# Patient Record
Sex: Female | Born: 1975 | Race: White | Hispanic: No | Smoking: Current every day smoker
Health system: Southern US, Community
[De-identification: ages and names within clinical notes are randomized; demographics above are authoritative.]

## PROBLEM LIST (undated history)

## (undated) DIAGNOSIS — R87619 Unspecified abnormal cytological findings in specimens from cervix uteri: Secondary | ICD-10-CM

## (undated) DIAGNOSIS — O24419 Gestational diabetes mellitus in pregnancy, unspecified control: Secondary | ICD-10-CM

## (undated) DIAGNOSIS — N946 Dysmenorrhea, unspecified: Secondary | ICD-10-CM

## (undated) DIAGNOSIS — Z8742 Personal history of other diseases of the female genital tract: Secondary | ICD-10-CM

## (undated) HISTORY — DX: Unspecified abnormal cytological findings in specimens from cervix uteri: R87.619

## (undated) HISTORY — DX: Dysmenorrhea, unspecified: N94.6

## (undated) HISTORY — PX: TONSILLECTOMY: SUR1361

## (undated) HISTORY — DX: Gestational diabetes mellitus in pregnancy, unspecified control: O24.419

---
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
## 2000-03-31 DIAGNOSIS — Z8742 Personal history of other diseases of the female genital tract: Secondary | ICD-10-CM

## 2000-03-31 HISTORY — DX: Personal history of other diseases of the female genital tract: Z87.42

## 2000-03-31 HISTORY — PX: PELVIC LAPAROSCOPY: SHX162

## 2004-08-01 ENCOUNTER — Ambulatory Visit (HOSPITAL_COMMUNITY): Admission: RE | Admit: 2004-08-01 | Discharge: 2004-08-01 | Payer: Self-pay | Admitting: Obstetrics and Gynecology

## 2004-08-16 ENCOUNTER — Ambulatory Visit (HOSPITAL_COMMUNITY): Admission: RE | Admit: 2004-08-16 | Discharge: 2004-08-16 | Payer: Self-pay | Admitting: Obstetrics and Gynecology

## 2005-10-05 IMAGING — US US OB FOLLOW-UP
1 series · 18 of 28 positions shown · non-contrast
Comparison: none

CLINICAL DATA: 29-year-old.  G2 P1 with LMP of 01/27/04.  Scan for growth.

[Series 1: us ob re-eval · 18 of 44 slices shown]
[im 1/44]
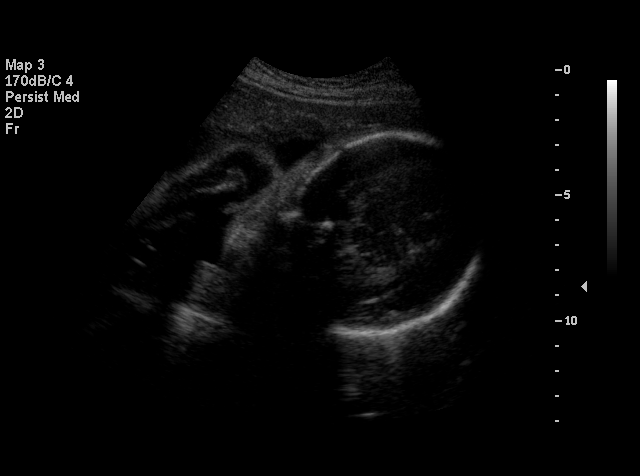
[im 4/44]
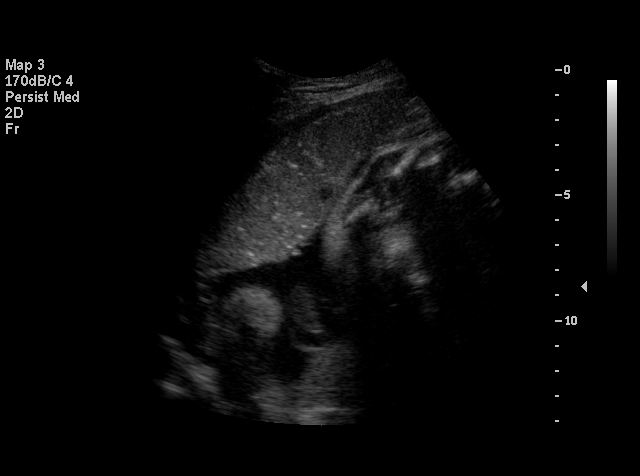
[im 5/44]
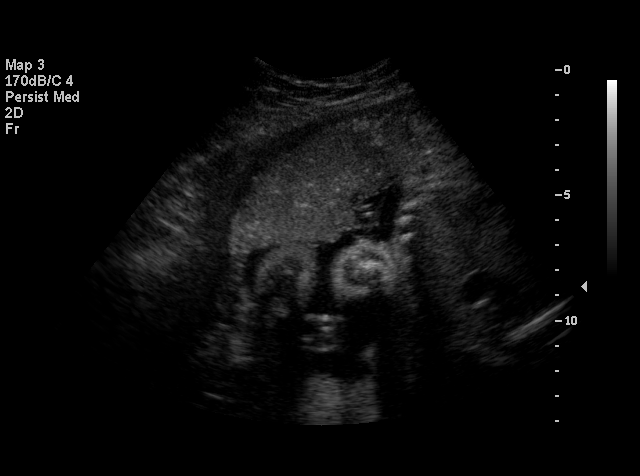
[im 8/44]
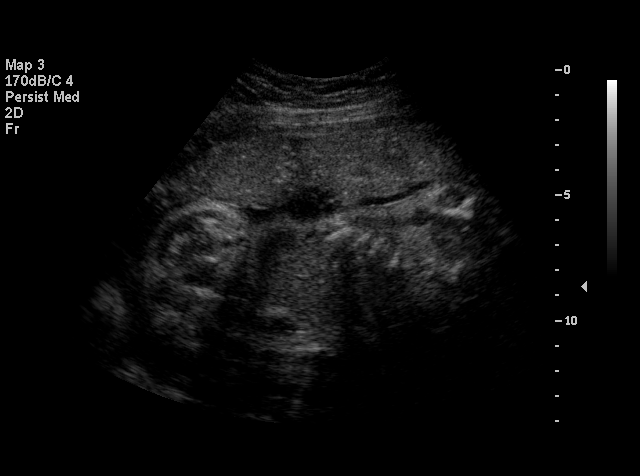
[im 12/44]
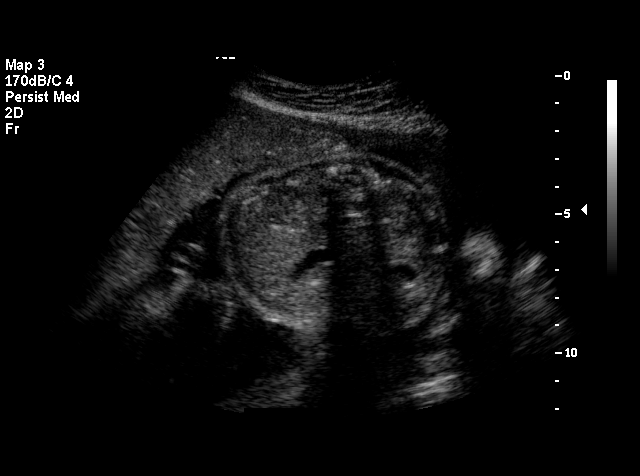
[im 13/44]
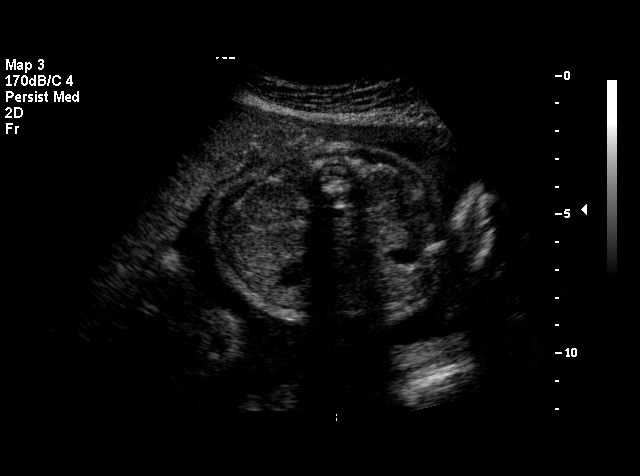
[im 16/44]
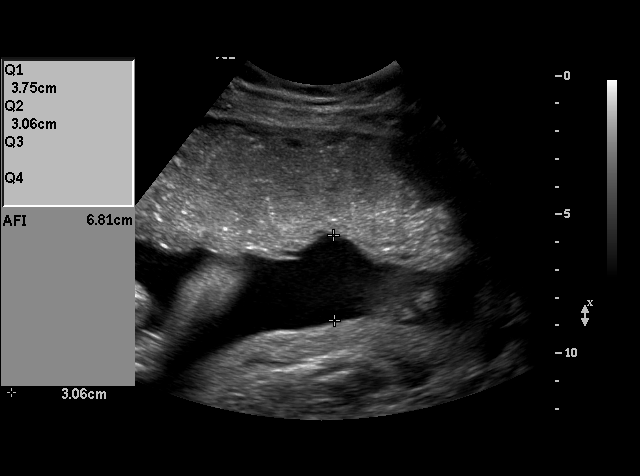
[im 18/44]
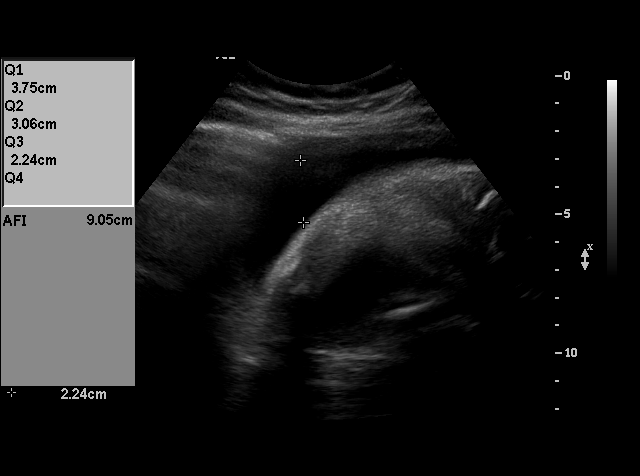
[im 21/44]
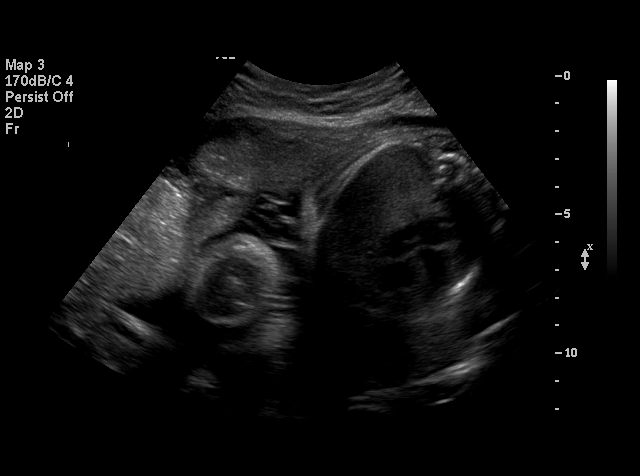
[im 23/44]
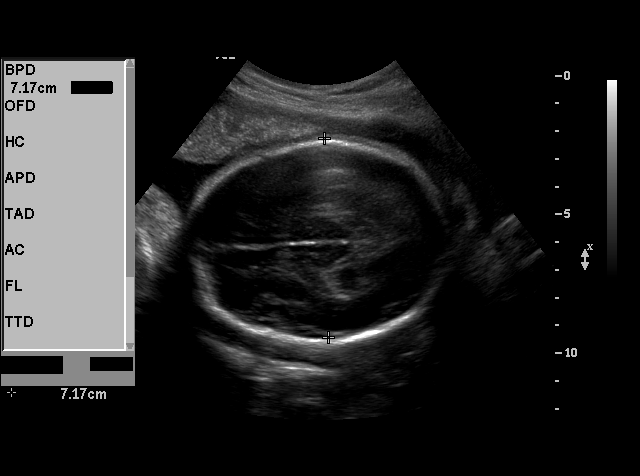
[im 26/44]
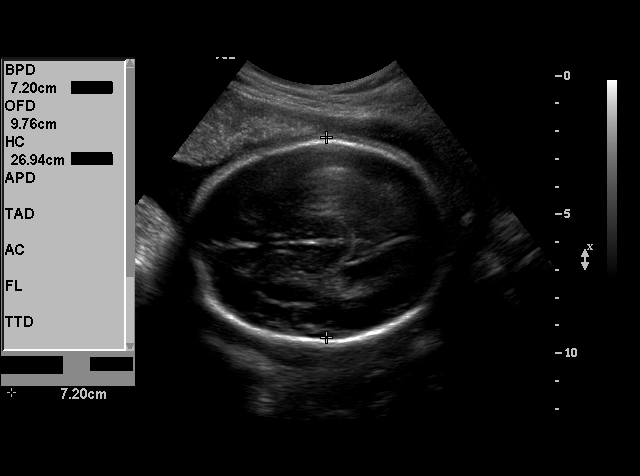
[im 28/44]
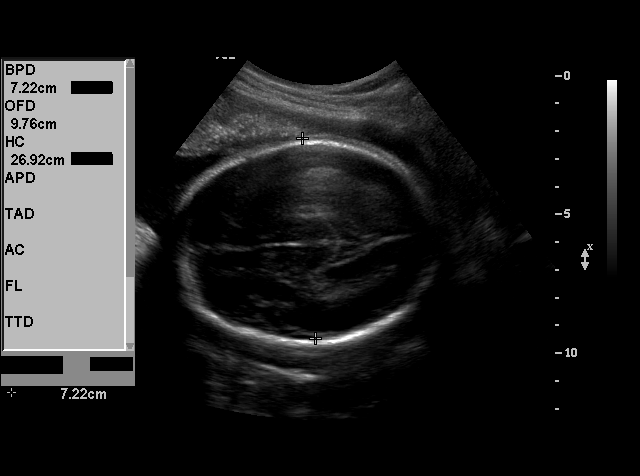
[im 31/44]
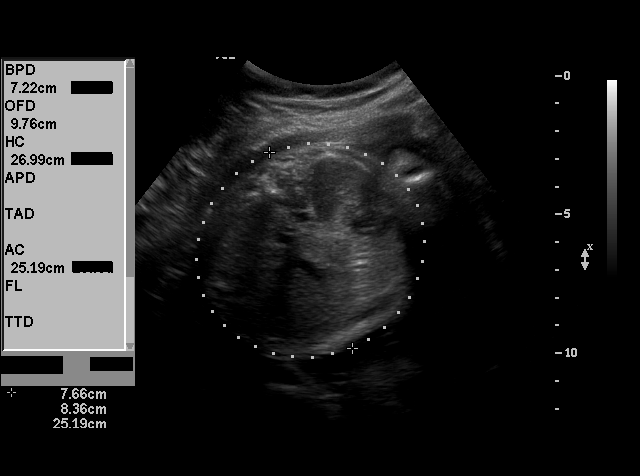
[im 34/44]
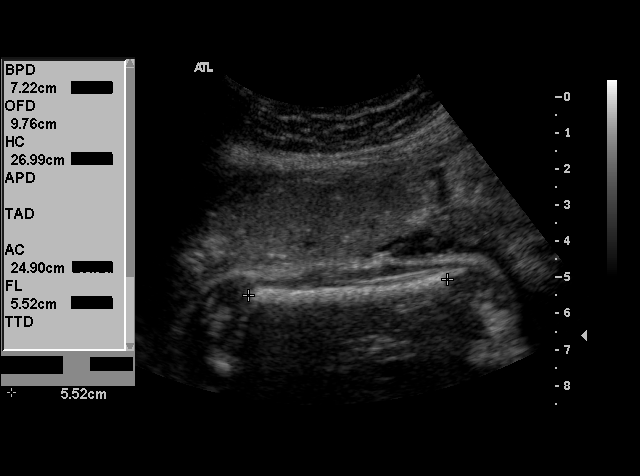
[im 36/44]
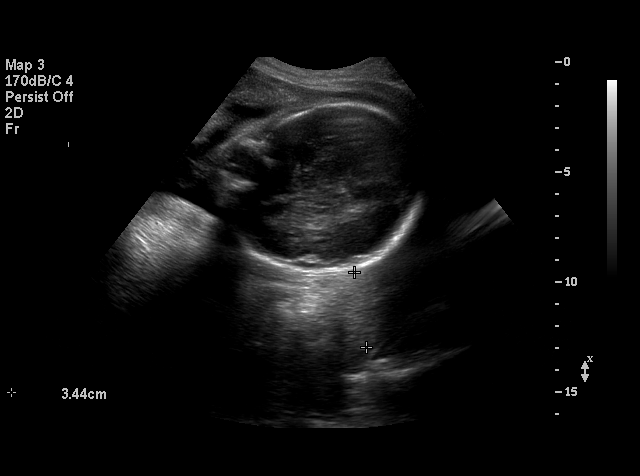
[im 39/44]
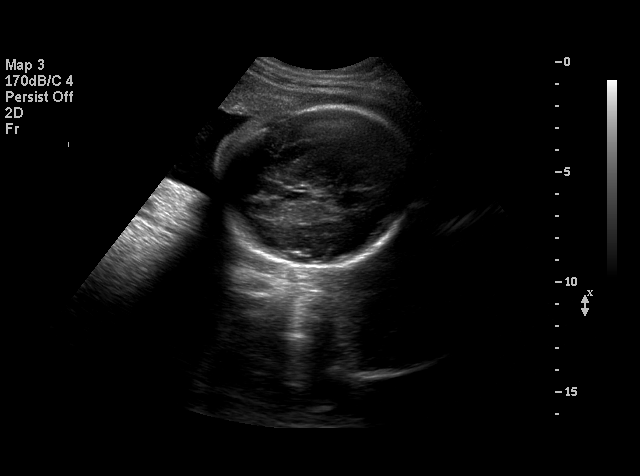
[im 40/44]
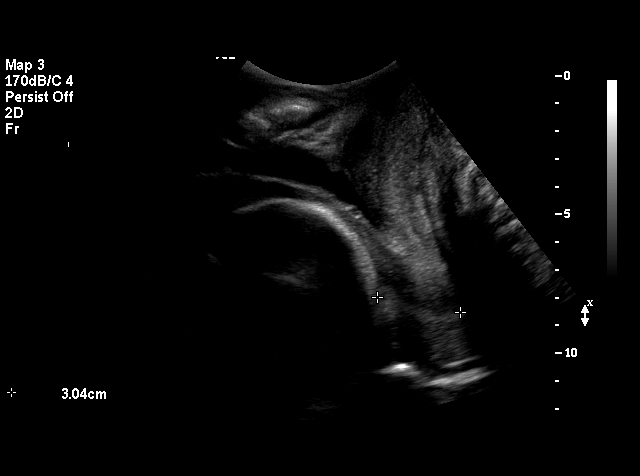
[im 44/44]
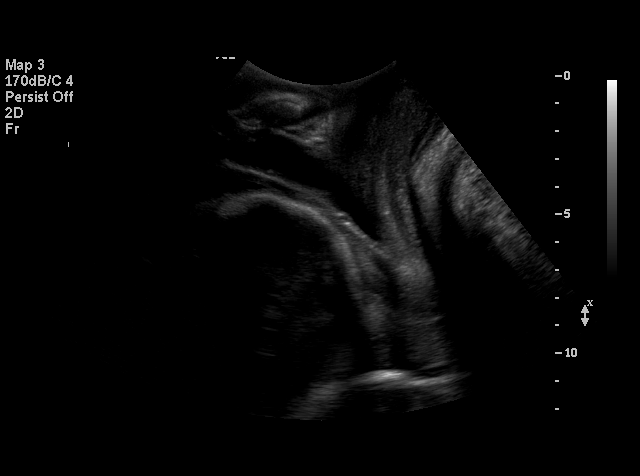

[18 of 28 positions shown; findings below may reference images not displayed]

OBSTETRICAL ULTRASOUND RE-EVALUATION:
 Number of Fetuses:  1
 Heart Rate:  133
 Movement:  Yes
 Breathing:  Yes
 Presentation:  Cephalic
 Placental Location:  Anterior
 Grade:  I
 Previa:  No
 Amniotic Fluid (subjective):  Normal
 Amniotic Fluid (objective):  11.5 cm AFI (5th -95th%ile = 9.2 – 23.1 cm for 29 wks)

 FETAL BIOMETRY
 BPD:  7.2 cm  29 w 0 d
 HC:  27.0 cm   29 w 3 d
 AC:  25.2 cm   29 w 3 d
 FL:   5.5 cm   29 w 1 d

 Mean GA:  29 w 2 d
 Assigned GA:  28 w 6 d

 EFW:  9722 g (H) 75th – 90th%ile (0241 – 3683 g) For 29 wks

 FETAL ANATOMY
 Lateral Ventricles:  Visualized 
 Thalami/CSP:  Previously seen 
 Posterior Fossa:  Previously seen   
 Nuchal Region:  N/A
 Spine:  Previously seen 
 4 Chamber Heart on Left:  Visualized 
 Stomach on Left:  Visualized 
 3 Vessel Cord:  Previously seen 
 Cord Insertion Site:  Previously seen 
 Kidneys:  Visualized 
 Bladder:  Visualized 
 Extremities:  Previously seen 

 MATERNAL UTERINE AND ADNEXAL FINDINGS
 Cervix:  3.4 cm Transabdominally
IMPRESSION: Single living intrauterine fetus in cephalic presentation. Amniotic fluid volume is within normal limits.  There has been slightly greater than expected interval growth since growth on 08/01/04 with estimated fetal weight in the 75th – 90th percentile.

## 2018-03-31 HISTORY — PX: BREAST SURGERY: SHX581

## 2019-01-03 ENCOUNTER — Other Ambulatory Visit: Payer: Self-pay

## 2019-01-03 ENCOUNTER — Ambulatory Visit (INDEPENDENT_AMBULATORY_CARE_PROVIDER_SITE_OTHER): Payer: Self-pay | Admitting: Obstetrics and Gynecology

## 2019-01-03 ENCOUNTER — Encounter: Payer: Self-pay | Admitting: Obstetrics and Gynecology

## 2019-01-03 VITALS — BP 122/68 | HR 70 | Temp 97.6°F | Resp 22 | Ht 64.0 in | Wt 110.8 lb

## 2019-01-03 DIAGNOSIS — N939 Abnormal uterine and vaginal bleeding, unspecified: Secondary | ICD-10-CM

## 2019-01-03 NOTE — Progress Notes (Signed)
GYNECOLOGY  VISIT   HPI: 43 y.o.   Widowed  Caucasian  female   G58P3 with Patient's last menstrual period was 12/20/2018 (exact date).   here for abnormal uterine bleeding for 2 weeks.  Patient states her cycle started 12-20-18 at normal time but has not quit bleeding. Bleeding has now decreased as of this am.  No pain or abdominal issues per patient.  She wonders if it is due to perimenopause. Denies hot flashes.   Generally not keeping track of her periods but they occur monthly. Usually her menses lasts 7 days.   Husband passed from [employer] disease 10 months ago.  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  Patient has had some weight loss.   No dizziness, lightheadedness, or fatigue.   79, 34, 52 yo children.   GYNECOLOGIC HISTORY: Patient's last menstrual period was 12/20/2018 (exact date). Contraception:  Abstinence. Not sexually active for 2 years. Menopausal hormone therapy:  none Last mammogram: 05/2017 showed inflammatory process in Lt.breast--[place], [place] Last pap smear: 3 years ago--normal per patient        OB History    Gravida  4   Para  3   Term      Preterm      AB      Living  3     SAB      TAB      Ectopic      Multiple      Live Births  3              There are no active problems to display for this patient.   Past Medical History:  Diagnosis Date  . Abnormal Pap smear of cervix 1990's   cryotherapy to cervix  . Dysmenorrhea   . Gestational diabetes   . History of ovarian cyst 2002    Past Surgical History:  Procedure Laterality Date  . BREAST SURGERY  2020   duct removed left breast due to infection  . PELVIC LAPAROSCOPY  2002   ovarian cysts--  . TONSILLECTOMY      No current outpatient medications on file.   No current facility-administered medications for this visit.      ALLERGIES: Patient has no known allergies.  Family History  Problem Relation Age of Onset  . Diabetes Mother   .  Hypertension Mother   . Diabetes Father   . Breast cancer Maternal Grandmother     Social History   Socioeconomic History  . Marital status: Widowed    Spouse name: Not on file  . Number of children: Not on file  . Years of education: Not on file  . Highest education level: Not on file  Occupational History  . Not on file  Social Needs  . Financial resource strain: Not on file  . Food insecurity    Worry: Not on file    Inability: Not on file  . Transportation needs    Medical: Not on file    Non-medical: Not on file  Tobacco Use  . Smoking status: Current Every Day Smoker    Packs/day: 0.50    Years: 25.00    Pack years: 12.50  . Smokeless tobacco: Never Used  Substance and Sexual Activity  . Alcohol use: Not Currently  . Drug use: Never  . Sexual activity: Not Currently    Birth control/protection: Abstinence  Lifestyle  . Physical activity    Days per week: Not on file  Minutes per session: Not on file  . Stress: Not on file  Relationships  . Social Musician on phone: Not on file    Gets together: Not on file    Attends religious service: Not on file    Active member of club or organization: Not on file    Attends meetings of clubs or organizations: Not on file    Relationship status: Not on file  . Intimate partner violence    Fear of current or ex partner: Not on file    Emotionally abused: Not on file    Physically abused: Not on file    Forced sexual activity: Not on file  Other Topics Concern  . Not on file  Social History Narrative  . Not on file    Review of Systems  All other systems reviewed and are negative.   PHYSICAL EXAMINATION:    BP 122/68   Pulse 70   Temp 97.6 F (36.4 C) (Temporal)   Resp (!) 22   Ht 5\' 4"  (1.626 m)   Wt 110 lb 12.8 oz (50.3 kg)   LMP 12/20/2018 (Exact Date)   Breastfeeding Unknown   BMI 19.02 kg/m     General appearance: alert, cooperative and appears stated age Head: Normocephalic,  without obvious abnormality, atraumatic Neck: no adenopathy, supple, symmetrical, trachea midline and thyroid normal to inspection and palpation Lungs: clear to auscultation bilaterally Heart: regular rate and rhythm Abdomen: soft, non-tender, no masses,  no organomegaly Extremities: extremities normal, atraumatic, no cyanosis or edema Skin: Skin color, texture, turgor normal. No rashes or lesions No abnormal inguinal nodes palpated Neurologic: Grossly normal  Pelvic: External genitalia:  no lesions              Urethra:  normal appearing urethra with no masses, tenderness or lesions              Bartholins and Skenes: normal                 Vagina: normal appearing vagina with normal color and discharge, no lesions              Cervix: no lesions.  Small amount of vaginal bleeding noted.                Bimanual Exam:  Uterus:  normal size, contour, position, consistency, mobility, non-tender              Adnexa: no mass, fullness, tenderness           Chaperone was present for exam.  ASSESSMENT  Abnormal uterine bleeding.  Likely anovulatory bleeding.  Hx cryotherapy to cervix.  Bereavement.   PLAN  We talked about perimenopause and anovulatory bleeding.  She declines a course of Provera.  No CBC or TSH today.  She will contact me back to do a pelvic ultrasound and pap if she has any additional irregular bleeding.  I gave her information about facilities for mammograms here in [place].  FU prn.    An After Visit Summary was printed and given to the patient.  __20____ minutes face to face time of which over 50% was spent in counseling.

## 2021-03-21 ENCOUNTER — Encounter: Payer: Self-pay | Admitting: Internal Medicine

## 2021-03-21 ENCOUNTER — Other Ambulatory Visit: Payer: Self-pay

## 2021-03-21 ENCOUNTER — Ambulatory Visit (INDEPENDENT_AMBULATORY_CARE_PROVIDER_SITE_OTHER): Payer: 59 | Admitting: Internal Medicine

## 2021-03-21 VITALS — BP 128/69 | HR 76 | Temp 97.5°F | Resp 17 | Ht 64.0 in | Wt 108.6 lb

## 2021-03-21 DIAGNOSIS — Z1231 Encounter for screening mammogram for malignant neoplasm of breast: Secondary | ICD-10-CM | POA: Diagnosis not present

## 2021-03-21 DIAGNOSIS — Z1211 Encounter for screening for malignant neoplasm of colon: Secondary | ICD-10-CM | POA: Diagnosis not present

## 2021-03-21 DIAGNOSIS — Z0001 Encounter for general adult medical examination with abnormal findings: Secondary | ICD-10-CM

## 2021-03-21 NOTE — Patient Instructions (Signed)

## 2021-03-21 NOTE — Progress Notes (Signed)
HPI  Pt presents to the clinic today to establish care. She would like her annual exam today.  Flu: 12/2020 Tetanus: < 10 years ago Covid: Pfizer Pap smear: 3-4 years ago Mammogram: 3-4 years ago Colon screening: never Vision screening: as needed Dentist: as needed  Diet: She does eat meat. She consumes veggies, no fruits. She does not eat fried foods. She drinks mostly water. Exercise: None  Past Medical History:  Diagnosis Date   Abnormal Pap smear of cervix 1990's   cryotherapy to cervix   Dysmenorrhea    Gestational diabetes    History of ovarian cyst 2002    No current outpatient medications on file.   No current facility-administered medications for this visit.    No Known Allergies  Family History  Problem Relation Age of Onset   Diabetes Mother    Hypertension Mother    Diabetes Father    Breast cancer Maternal Grandmother     Social History   Socioeconomic History   Marital status: Widowed    Spouse name: Not on file   Number of children: Not on file   Years of education: Not on file   Highest education level: Not on file  Occupational History   Not on file  Tobacco Use   Smoking status: Every Day    Packs/day: 0.50    Years: 25.00    Pack years: 12.50    Types: Cigarettes   Smokeless tobacco: Never  Vaping Use   Vaping Use: Never used  Substance and Sexual Activity   Alcohol use: Yes    Comment: socially   Drug use: Never   Sexual activity: Not Currently    Birth control/protection: Abstinence  Other Topics Concern   Not on file  Social History Narrative   Not on file   Social Determinants of Health   Financial Resource Strain: Not on file  Food Insecurity: Not on file  Transportation Needs: Not on file  Physical Activity: Not on file  Stress: Not on file  Social Connections: Not on file  Intimate Partner Violence: Not on file    ROS:  Constitutional: Denies fever, malaise, fatigue, headache or abrupt weight changes.   HEENT: Denies eye pain, eye redness, ear pain, ringing in the ears, wax buildup, runny nose, nasal congestion, bloody nose, or sore throat. Respiratory: Denies difficulty breathing, shortness of breath, cough or sputum production.   Cardiovascular: Denies chest pain, chest tightness, palpitations or swelling in the hands or feet.  Gastrointestinal: Denies abdominal pain, bloating, constipation, diarrhea or blood in the stool.  GU: Denies frequency, urgency, pain with urination, blood in urine, odor or discharge. Musculoskeletal: Pt reports intermittent neck and hip pain. Denies decrease in range of motion, difficulty with gait, muscle pain or joint swelling.  Skin: Denies redness, rashes, lesions or ulcercations.  Neurological: Denies dizziness, difficulty with memory, difficulty with speech or problems with balance and coordination.  Psych: Pt reports perimenstrual mood issues. Denies anxiety, depression, SI/HI.  No other specific complaints in a complete review of systems (except as listed in HPI above).  PE:  BP 128/69 (BP Location: Right Arm, Patient Position: Sitting, Cuff Size: Small)    Pulse 76    Temp (!) 97.5 F (36.4 C) (Temporal)    Resp 17    Ht 5' 4"  (1.626 m)    Wt 108 lb 9.6 oz (49.3 kg)    LMP 02/21/2021    SpO2 100%    BMI 18.64 kg/m  Wt Readings from  Last 3 Encounters:  03/21/21 108 lb 9.6 oz (49.3 kg)  01/03/19 110 lb 12.8 oz (50.3 kg)    General: Appears her stated age, well developed, well nourished in NAD. HEENT: Head: normal shape and size; Eyes: sclera white and EOMs intact;  Neck: Neck supple, trachea midline. No masses, lumps or thyromegaly present.  Cardiovascular: Normal rate and rhythm. S1,S2 noted.  No murmur, rubs or gallops noted. No JVD or BLE edema.  Pulmonary/Chest: Normal effort and positive vesicular breath sounds. No respiratory distress. No wheezes, rales or ronchi noted.  Abdomen: Soft and nontender. Normal bowel sounds, no bruits noted. No  distention or masses noted. Liver, spleen and kidneys non palpable. Musculoskeletal: Normal abduction, abduction, internal and external rotation rotation of bilateral hips.  No pain with palpation of the hips.  Strength 5/5 BUE/BLE. No difficulty with gait.  Neurological: Alert and oriented. Cranial nerves II-XII grossly intact. Coordination normal.  Psychiatric: Mood and affect normal. Behavior is normal. Judgment and thought content normal.      Assessment and Plan:  Preventative Health Maintenance:  Flu shot UTD Tetanus UTD per her report Encouraged her to get a covid booster Pap smear due next year Mammogram ordered-she will call to schedule Referral to GI placed for screening colonoscopy Encouraged her to consume a balanced diet and exercise regimen Advised her to see an eye doctor and dentist annually We will check CBC, c-Met, TSH, lipid, A1c today  RTC in 1 year, sooner if needed Webb Silversmith, NP This visit occurred during the SARS-CoV-2 public health emergency.  Safety protocols were in place, including screening questions prior to the visit, additional usage of staff PPE, and extensive cleaning of exam room while observing appropriate contact time as indicated for disinfecting solutions.

## 2021-03-22 LAB — COMPLETE METABOLIC PANEL WITH GFR
AG Ratio: 1.8 (calc) (ref 1.0–2.5)
ALT: 26 U/L (ref 6–29)
AST: 23 U/L (ref 10–35)
Albumin: 4.6 g/dL (ref 3.6–5.1)
Alkaline phosphatase (APISO): 64 U/L (ref 31–125)
BUN: 12 mg/dL (ref 7–25)
CO2: 28 mmol/L (ref 20–32)
Calcium: 9.8 mg/dL (ref 8.6–10.2)
Chloride: 105 mmol/L (ref 98–110)
Creat: 0.65 mg/dL (ref 0.50–0.99)
Globulin: 2.5 g/dL (calc) (ref 1.9–3.7)
Glucose, Bld: 95 mg/dL (ref 65–139)
Potassium: 4.4 mmol/L (ref 3.5–5.3)
Sodium: 141 mmol/L (ref 135–146)
Total Bilirubin: 0.4 mg/dL (ref 0.2–1.2)
Total Protein: 7.1 g/dL (ref 6.1–8.1)
eGFR: 109 mL/min/{1.73_m2} (ref >=60)

## 2021-03-22 LAB — LIPID PANEL
Cholesterol: 188 mg/dL (ref <200)
HDL: 60 mg/dL (ref >=50)
LDL Cholesterol (Calc): 111 mg/dL (calc) — ABNORMAL HIGH
Non-HDL Cholesterol (Calc): 128 mg/dL (calc) (ref <130)
Total CHOL/HDL Ratio: 3.1 (calc) (ref <5.0)
Triglycerides: 84 mg/dL (ref <150)

## 2021-03-22 LAB — HEMOGLOBIN A1C
Hgb A1c MFr Bld: 5.4 % of total Hgb (ref <5.7)
Mean Plasma Glucose: 108 mg/dL
eAG (mmol/L): 6 mmol/L

## 2021-03-22 LAB — CBC
HCT: 46.3 % — ABNORMAL HIGH (ref 35.0–45.0)
Hemoglobin: 15.6 g/dL — ABNORMAL HIGH (ref 11.7–15.5)
MCH: 31.9 pg (ref 27.0–33.0)
MCHC: 33.7 g/dL (ref 32.0–36.0)
MCV: 94.7 fL (ref 80.0–100.0)
MPV: 10.6 fL (ref 7.5–12.5)
Platelets: 188 10*3/uL (ref 140–400)
RBC: 4.89 10*6/uL (ref 3.80–5.10)
RDW: 12.1 % (ref 11.0–15.0)
WBC: 7.2 10*3/uL (ref 3.8–10.8)

## 2021-03-22 LAB — TSH: TSH: 1.66 mIU/L

## 2021-03-29 ENCOUNTER — Other Ambulatory Visit: Payer: Self-pay

## 2021-03-29 DIAGNOSIS — Z1211 Encounter for screening for malignant neoplasm of colon: Secondary | ICD-10-CM

## 2021-03-29 MED ORDER — PEG 3350-KCL-NA BICARB-NACL 420 G PO SOLR: 4000.0000 mL | Freq: Once | ORAL | 0 refills | Status: AC

## 2021-03-29 NOTE — Progress Notes (Signed)
Gastroenterology Pre-Procedure Review  Request Date: 04/23/2021 Requesting Physician: Dr. Tobi Bastos  PATIENT REVIEW QUESTIONS: The patient responded to the following health history questions as indicated:    1. Are you having any GI issues? no 2. Do you have a personal history of Polyps? no 3. Do you have a family history of Colon Cancer or Polyps? yes (Mother- polyps) 4. Diabetes Mellitus? no 5. Joint replacements in the past 12 months?no 6. Major health problems in the past 3 months?no 7. Any artificial heart valves, MVP, or defibrillator?no    MEDICATIONS & ALLERGIES:    Patient reports the following regarding taking any anticoagulation/antiplatelet therapy:   Plavix, Coumadin, Eliquis, Xarelto, Lovenox, Pradaxa, Brilinta, or Effient? no Aspirin? no  Patient confirms/reports the following medications:  No current outpatient medications on file.   No current facility-administered medications for this visit.    Patient confirms/reports the following allergies:  No Known Allergies  No orders of the defined types were placed in this encounter.   AUTHORIZATION INFORMATION Primary Insurance: 1D#: Group #:  Secondary Insurance: 1D#: Group #:  SCHEDULE INFORMATION: Date: 04/23/2021 Time: Location: [facility]

## 2021-04-03 ENCOUNTER — Telehealth: Payer: Self-pay

## 2021-04-03 NOTE — Telephone Encounter (Signed)
Called patient as a reminder about the updated insurance card. Also, informed patient if she still has [employer]. [facility] no longer accepts this insurance. Left the office fax number.

## 2021-04-04 NOTE — Telephone Encounter (Signed)
Returned call to patient. LVM to fax information.

## 2021-04-04 NOTE — Telephone Encounter (Signed)
Pt called with new insurance information. Please give her a call. Thank you.

## 2021-04-18 ENCOUNTER — Telehealth: Payer: Self-pay | Admitting: Gastroenterology

## 2021-04-18 NOTE — Telephone Encounter (Signed)
Procedure has been cancelled for 01/24 per request of patient. Endo unit has been notified of change.

## 2021-04-18 NOTE — Telephone Encounter (Signed)
Inbound call from pt requesting to cxl her procedure. Pt stated she does not have transportation at this time and will call back to r/s her procedure. Thank you.

## 2021-04-23 ENCOUNTER — Ambulatory Visit: Admission: RE | Admit: 2021-04-23 | Payer: 59 | Source: Home / Self Care | Admitting: Gastroenterology

## 2021-04-23 ENCOUNTER — Encounter: Admission: RE | Payer: Self-pay | Source: Home / Self Care

## 2021-04-23 SURGERY — COLONOSCOPY WITH PROPOFOL
Anesthesia: General

## 2021-10-25 ENCOUNTER — Encounter: Payer: Self-pay | Admitting: Internal Medicine

## 2021-10-25 ENCOUNTER — Ambulatory Visit (INDEPENDENT_AMBULATORY_CARE_PROVIDER_SITE_OTHER): Payer: 59 | Admitting: Internal Medicine

## 2021-10-25 VITALS — BP 126/74 | HR 101 | Temp 98.7°F | Wt 107.0 lb

## 2021-10-25 DIAGNOSIS — R454 Irritability and anger: Secondary | ICD-10-CM | POA: Diagnosis not present

## 2021-10-25 DIAGNOSIS — R5383 Other fatigue: Secondary | ICD-10-CM | POA: Diagnosis not present

## 2021-10-25 DIAGNOSIS — R140 Abdominal distension (gaseous): Secondary | ICD-10-CM

## 2021-10-25 DIAGNOSIS — E7800 Pure hypercholesterolemia, unspecified: Secondary | ICD-10-CM | POA: Insufficient documentation

## 2021-10-25 DIAGNOSIS — R232 Flushing: Secondary | ICD-10-CM

## 2021-10-25 DIAGNOSIS — D751 Secondary polycythemia: Secondary | ICD-10-CM | POA: Insufficient documentation

## 2021-10-25 DIAGNOSIS — G8929 Other chronic pain: Secondary | ICD-10-CM

## 2021-10-25 DIAGNOSIS — F3289 Other specified depressive episodes: Secondary | ICD-10-CM | POA: Diagnosis not present

## 2021-10-25 DIAGNOSIS — M79602 Pain in left arm: Secondary | ICD-10-CM

## 2021-10-25 DIAGNOSIS — M542 Cervicalgia: Secondary | ICD-10-CM

## 2021-10-25 DIAGNOSIS — N951 Menopausal and female climacteric states: Secondary | ICD-10-CM

## 2021-10-25 NOTE — Patient Instructions (Signed)
Perimenopause Perimenopause is the normal time of a woman's life when the levels of estrogen, the female hormone produced by the ovaries, begin to decrease. This leads to changes in menstrual periods before they stop completely (menopause). Perimenopause can begin 2-8 years before menopause. During perimenopause, the ovaries may or may not produce an egg and a woman can still become pregnant. What are the causes? This condition is caused by a natural change in hormone levels that happens as you get older. What increases the risk? This condition is more likely to start at an earlier age if you have certain medical conditions or have undergone treatments, including: A tumor of the pituitary gland in the brain. A disease that affects the ovaries and hormone production. Certain cancer treatments, such as chemotherapy or hormone therapy, or radiation therapy on the pelvis. Heavy smoking and excessive alcohol use. Family history of early menopause. What are the signs or symptoms? Perimenopausal changes affect each woman differently. Symptoms of this condition may include: Hot flashes. Irregular menstrual periods. Night sweats. Changes in feelings about sex. This could be a decrease in sex drive or an increased discomfort around your sexuality. Vaginal dryness. Headaches. Mood swings. Depression. Problems sleeping (insomnia). Memory problems or trouble concentrating. Irritability. Tiredness. Weight gain. Anxiety. Trouble getting pregnant. How is this diagnosed? This condition is diagnosed based on your medical history, a physical exam, your age, your menstrual history, and your symptoms. Hormone tests may also be done. How is this treated? In some cases, no treatment is needed. You and your health care provider should make a decision together about whether treatment is necessary. Treatment will be based on your individual condition and preferences. Various treatments are available, such  as: Menopausal hormone therapy (MHT). Medicines to treat specific symptoms. Acupuncture. Vitamin or herbal supplements. Before starting treatment, make sure to let your health care provider know if you have a personal or family history of: Heart disease. Breast cancer. Blood clots. Diabetes. Osteoporosis. Follow these instructions at home: Medicines Take over-the-counter and prescription medicines only as told by your health care provider. Take vitamin supplements only as told by your health care provider. Talk with your health care provider before starting any herbal supplements. Lifestyle  Do not use any products that contain nicotine or tobacco, such as cigarettes, e-cigarettes, and chewing tobacco. If you need help quitting, ask your health care provider. Get at least 30 minutes of physical activity on 5 or more days each week. Eat a balanced diet that includes fresh fruits and vegetables, whole grains, soybeans, eggs, lean meat, and low-fat dairy. Avoid alcoholic and caffeinated beverages, as well as spicy foods. This may help prevent hot flashes. Get 7-8 hours of sleep each night. Dress in layers that can be removed to help you manage hot flashes. Find ways to manage stress, such as deep breathing, meditation, or journaling. General instructions  Keep track of your menstrual periods, including: When they occur. How heavy they are and how long they last. How much time passes between periods. Keep track of your symptoms, noting when they start, how often you have them, and how long they last. Use vaginal lubricants or moisturizers to help with vaginal dryness and improve comfort during sex. You can still become pregnant if you are having irregular periods. Make sure you use contraception during perimenopause if you do not want to get pregnant. Keep all follow-up visits. This is important. This includes any group therapy or counseling. Contact a health care provider if: You  have   heavy vaginal bleeding or pass blood clots. Your period lasts more than 2 days longer than normal. Your periods are recurring sooner than 21 days. You bleed after having sex. You have pain during sex. Get help right away if you have: Chest pain, trouble breathing, or trouble talking. Severe depression. Pain when you urinate. Severe headaches. Vision problems. Summary Perimenopause is the time when a woman's body begins to move into menopause. This may happen naturally or as a result of other health problems or medical treatments. Perimenopause can begin 2-8 years before menopause, and it can last for several years. Perimenopausal symptoms can be managed through medicines, lifestyle changes, and complementary therapies such as acupuncture. This information is not intended to replace advice given to you by your health care provider. Make sure you discuss any questions you have with your health care provider. Document Revised: 09/01/2019 Document Reviewed: 09/01/2019 Elsevier Patient Education  2023 [employer]  

## 2021-10-25 NOTE — Progress Notes (Signed)
Subjective:    Patient ID: Kim Hogan, female    DOB: [birth year], 46 y.o.   MRN: [id number]  HPI  Patient presents to the clinic today with complaint of depression. This has been an ongoing issue that she seems to be having more difficulty managing. She reports associated fatigue, bloating, weight gain, irritability, mood swings and hot flashes. Her LMP was 07/2021.  She also reports weakness in her left forearm. She noticed this in the last few months. She reports right shoulder pan and neck pain as well. She describes the forearm pain as dull ache. That pain is worse with the lifting. She describes the neck pain as stiff and achy. She denies numbness and tingling in her arms. She has associated headaches. She takes Ibuprofen as needed with some relief of symptoms.  Review of Systems     Past Medical History:  Diagnosis Date   Abnormal Pap smear of cervix 1990's   cryotherapy to cervix   Dysmenorrhea    Gestational diabetes    History of ovarian cyst 2002    No current outpatient medications on file.   No current facility-administered medications for this visit.    No Known Allergies  Family History  Problem Relation Age of Onset   Diabetes Mother    Hypertension Mother    Diabetes Father    Breast cancer Maternal Grandmother     Social History   Socioeconomic History   Marital status: Widowed    Spouse name: Not on file   Number of children: Not on file   Years of education: Not on file   Highest education level: Not on file  Occupational History   Not on file  Tobacco Use   Smoking status: Every Day    Packs/day: 0.50    Years: 25.00    Total pack years: 12.50    Types: Cigarettes   Smokeless tobacco: Never  Vaping Use   Vaping Use: Never used  Substance and Sexual Activity   Alcohol use: Yes    Comment: socially   Drug use: Never   Sexual activity: Not Currently    Birth control/protection: Abstinence  Other Topics Concern   Not on file   Social History Narrative   Not on file   Social Determinants of Health   Financial Resource Strain: Not on file  Food Insecurity: Not on file  Transportation Needs: Not on file  Physical Activity: Not on file  Stress: Not on file  Social Connections: Not on file  Intimate Partner Violence: Not on file     Constitutional: Pt reports fatigue, headaches. Denies fever, malaise, or abrupt weight changes.  HEENT: Denies eye pain, eye redness, ear pain, ringing in the ears, wax buildup, runny nose, nasal congestion, bloody nose, or sore throat. Respiratory: Denies difficulty breathing, shortness of breath, cough or sputum production.   Cardiovascular: Denies chest pain, chest tightness, palpitations or swelling in the hands or feet.  Gastrointestinal: Pt reports bloating. Denies abdominal pain, bloating, constipation, diarrhea or blood in the stool.  GU: Pt reports irregular periods. Denies urgency, frequency, pain with urination, burning sensation, blood in urine, odor or discharge. Musculoskeletal: Pt reports chronic neck pain, left forearm pain. Denies decrease in range of motion, difficulty with gait, muscle pain or joint pain and swelling.  Skin: Denies redness, rashes, lesions or ulcercations.  Neurological: Denies dizziness, difficulty with memory, difficulty with speech or problems with balance and coordination.  Psych: Pt reports depression. Denies anxiety, SI/HI.  No  other specific complaints in a complete review of systems (except as listed in HPI above).  Objective:   Physical Exam  BP 126/74 (BP Location: Left Arm, Patient Position: Sitting, Cuff Size: Normal)   Pulse (!) 101   Temp 98.7 F (37.1 C) (Temporal)   Wt 107 lb (48.5 kg)   SpO2 95%   BMI 18.37 kg/m   Wt Readings from Last 3 Encounters:  03/21/21 108 lb 9.6 oz (49.3 kg)  01/03/19 110 lb 12.8 oz (50.3 kg)    General: Appears her stated age, well developed, well nourished in NAD. Skin: Warm, dry and  intact.  HEENT: Head: normal shape and size; Eyes: sclera white, no icterus, conjunctiva pink, PERRLA and EOMs intact; Cardiovascular: Tachycardic with normal rhythm . Pulmonary/Chest: Normal effort and positive vesicular breath sounds. No respiratory distress. No wheezes, rales or ronchi noted.  Musculoskeletal: Normal flexion, extension and rotation of the cervical spine. Normal flexion, extension and rotation of the left elbow. No difficulty with gait.  Neurological: Alert and oriented.  Psychiatric: Mood and affect normal. Behavior is normal. Judgment and thought content normal.   BMET    Component Value Date/Time   NA 141 03/21/2021 1056   K 4.4 03/21/2021 1056   CL 105 03/21/2021 1056   CO2 28 03/21/2021 1056   GLUCOSE 95 03/21/2021 1056   BUN 12 03/21/2021 1056   CREATININE 0.65 03/21/2021 1056   CALCIUM 9.8 03/21/2021 1056    Lipid Panel     Component Value Date/Time   CHOL 188 03/21/2021 1056   TRIG 84 03/21/2021 1056   HDL 60 03/21/2021 1056   CHOLHDL 3.1 03/21/2021 1056   LDLCALC 111 (H) 03/21/2021 1056    CBC    Component Value Date/Time   WBC 7.2 03/21/2021 1056   RBC 4.89 03/21/2021 1056   HGB 15.6 (H) 03/21/2021 1056   HCT 46.3 (H) 03/21/2021 1056   PLT 188 03/21/2021 1056   MCV 94.7 03/21/2021 1056   MCH 31.9 03/21/2021 1056   MCHC 33.7 03/21/2021 1056   RDW 12.1 03/21/2021 1056    Hgb A1C Lab Results  Component Value Date   HGBA1C 5.4 03/21/2021           Assessment & Plan:  Depression, Irritability, Fatigue, Bloating, Hot Flashes:  She would not be a candidate for hormonal therapy because she smokes Discussed trying black cohosh versus estrogen OTC versus a medication such as paroxetine or venlafaxine She would like to try estrogen first  Left Forearm Pain, Chronic Neck Pain:  She likely has tendinitis of the left elbow.   She does not want to try steroids at this time Recommend ibuprofen 600 mg twice daily x1 week, avoid overuse of  the left forearm She will upload the report of the next x-ray that she had done for further evaluation  RTC in 5 months for your annual exam Nicki Reaper, NP

## 2021-12-17 ENCOUNTER — Encounter: Payer: Self-pay | Admitting: Internal Medicine

## 2021-12-18 MED ORDER — PAROXETINE HCL ER 12.5 MG PO TB24: 12.5000 mg | ORAL_TABLET | Freq: Every day | ORAL | 0 refills | Status: DC

## 2021-12-18 NOTE — Addendum Note (Signed)
Addended by: Jearld Fenton on: 12/18/2021 06:09 PM   Modules accepted: Orders

## 2022-03-14 ENCOUNTER — Other Ambulatory Visit: Payer: Self-pay | Admitting: Internal Medicine

## 2022-03-14 MED ORDER — PAROXETINE HCL ER 12.5 MG PO TB24: 12.5000 mg | ORAL_TABLET | Freq: Every day | ORAL | 0 refills | Status: DC

## 2022-03-14 NOTE — Telephone Encounter (Signed)
Patient will need an office visit for further refills. Requested Prescriptions  Pending Prescriptions Disp Refills   PARoxetine (PAXIL-CR) 12.5 MG 24 hr tablet [Pharmacy Med Name: PAROXETINE ER 12.5 MG TABLET] 90 tablet 0    Sig: TAKE 1 TABLET BY MOUTH EVERY DAY     Psychiatry:  Antidepressants - SSRI Passed - 03/14/2022 12:37 AM      Passed - Valid encounter within last 6 months    Recent Outpatient Visits           4 months ago Other depression   [facility] [place], Salvadore Oxford, NP   11 months ago Encounter for general adult medical examination with abnormal findings   [facility] [place], Salvadore Oxford, NP

## 2022-08-13 ENCOUNTER — Encounter: Payer: Self-pay | Admitting: Internal Medicine

## 2022-08-14 MED ORDER — PAROXETINE HCL ER 12.5 MG PO TB24: 12.5000 mg | ORAL_TABLET | Freq: Every day | ORAL | 1 refills | Status: AC
